# Patient Record
Sex: Male | Born: 1985 | Race: White | Hispanic: No | Marital: Married | Smoking: Former smoker
Health system: Southern US, Community
[De-identification: ages and names within clinical notes are randomized; demographics above are authoritative.]

## PROBLEM LIST (undated history)

## (undated) DIAGNOSIS — I471 Supraventricular tachycardia, unspecified: Secondary | ICD-10-CM

## (undated) DIAGNOSIS — J40 Bronchitis, not specified as acute or chronic: Secondary | ICD-10-CM

## (undated) DIAGNOSIS — F419 Anxiety disorder, unspecified: Secondary | ICD-10-CM

## (undated) DIAGNOSIS — J189 Pneumonia, unspecified organism: Secondary | ICD-10-CM

## (undated) HISTORY — DX: Supraventricular tachycardia: I47.1

---

## 2019-10-03 ENCOUNTER — Emergency Department
Admission: RE | Admit: 2019-10-03 | Discharge: 2019-10-03 | Disposition: A | Discharge status: Home / Self Care | Payer: Self-pay | Source: Ambulatory Visit | Attending: Family Medicine | Admitting: Family Medicine

## 2019-10-03 ENCOUNTER — Emergency Department (INDEPENDENT_AMBULATORY_CARE_PROVIDER_SITE_OTHER): Payer: Self-pay

## 2019-10-03 ENCOUNTER — Other Ambulatory Visit: Payer: Self-pay

## 2019-10-03 VITALS — BP 128/85 | HR 87 | Temp 97.4°F | Resp 18 | Ht 73.0 in | Wt 213.0 lb

## 2019-10-03 DIAGNOSIS — R202 Paresthesia of skin: Secondary | ICD-10-CM

## 2019-10-03 HISTORY — DX: Anxiety disorder, unspecified: F41.9

## 2019-10-03 HISTORY — DX: Supraventricular tachycardia, unspecified: I47.10

## 2019-10-03 NOTE — ED Triage Notes (Signed)
Patient here for concerns about chest and arm pain over past 3 days; in very stressful work situation with little rest; history of SVT at age 34; no recent heart concerns. Did take ASA yesterday for left arm pain which radiates between elbow and shoulder. Has not had covid vaccination.

## 2019-10-03 NOTE — ED Notes (Signed)
Call to [facility]: requesting information about today's visit and they will then call patient with an appointment.

## 2019-10-03 NOTE — ED Provider Notes (Signed)
[name]    CSN: [id number] Arrival date & time: 10/03/19  1232      History   Chief Complaint Chief Complaint  Patient presents with  . Chest Pain    HPI Justin Travis is a 34 y.o. male.   While sitting on a couch three days ago patient developed vague pain in his left elbow that resolved spontaneously.  He recalls no injury or change in activities.  He has had recurring brief episodes of tingling sensations in his left elbow and upper arm.  During an 8 hour shift at work yesterday, sitting at a desk, he had prolonged paresthesias in his left arm and also noted decreased grip strength in his left hand.  He also has had vague intermittent discomfort in his left upper chest.  His symptoms occur only at rest.   The history is provided by the patient.    Past Medical History:  Diagnosis Date  . Anxiety   . SVT (supraventricular tachycardia) (HCC)    none since age 63    There are no problems to display for this patient.   History reviewed. No pertinent surgical history.     Home Medications    Prior to Admission medications   Not on File    Family History No family history on file.  Social History Social History   Tobacco Use  . Smoking status: Not on file  Substance Use Topics  . Alcohol use: Not on file  . Drug use: Not on file     Allergies   Patient has no known allergies.   Review of Systems Review of Systems  Constitutional: Negative for activity change, appetite change, chills, diaphoresis, fatigue and fever.  HENT: Negative.   Eyes: Negative.   Respiratory: Negative.   Cardiovascular: Positive for chest pain. Negative for palpitations and leg swelling.  Gastrointestinal: Negative.   Genitourinary: Negative.   Musculoskeletal: Negative.  Negative for neck pain and neck stiffness.  Skin: Negative.   Neurological: Positive for numbness. Negative for dizziness, tremors, seizures, syncope, speech difficulty, weakness,  light-headedness and headaches.  Hematological: Negative.      Physical Exam Triage Vital Signs ED Triage Vitals  Enc Vitals Group     BP 10/03/19 1304 128/85     Pulse Rate 10/03/19 1304 87     Resp 10/03/19 1304 18     Temp 10/03/19 1304 (!) 97.4 F (36.3 C)     Temp Source 10/03/19 1304 Oral     SpO2 10/03/19 1304 98 %     Weight 10/03/19 1305 (!) 213 lb (96.6 kg)     Height 10/03/19 1305 6\' 1"  (1.854 m)     Head Circumference --      Peak Flow --      Pain Score 10/03/19 1304 3     Pain Loc --      Pain Edu? --      Excl. in GC? --    No data found.  Updated Vital Signs BP 128/85 (BP Location: Left Arm)   Pulse 87   Temp (!) 97.4 F (36.3 C) (Oral)   Resp 18   Ht 6\' 1"  (1.854 m)   Wt (!) 96.6 kg   SpO2 98%   BMI 28.10 kg/m   Visual Acuity Right Eye Distance:   Left Eye Distance:   Bilateral Distance:    Right Eye Near:   Left Eye Near:    Bilateral Near:     Physical Exam Vitals  and nursing note reviewed.  Constitutional:      General: He is not in acute distress.    Appearance: He is not ill-appearing.  HENT:     Head: Normocephalic.     Right Ear: External ear normal.     Left Ear: External ear normal.     Nose: Nose normal.     Mouth/Throat:     Mouth: Mucous membranes are moist.     Pharynx: Oropharynx is clear.  Eyes:     Extraocular Movements: Extraocular movements intact.     Conjunctiva/sclera: Conjunctivae normal.     Pupils: Pupils are equal, round, and reactive to light.  Neck:     Comments: Patient's symptoms are not recreated by neck movement. Cardiovascular:     Rate and Rhythm: Normal rate and regular rhythm.     Pulses: Normal pulses.     Heart sounds: Normal heart sounds.  Pulmonary:     Breath sounds: Normal breath sounds.  Chest:     Chest wall: No tenderness.  Abdominal:     General: Abdomen is flat. Bowel sounds are normal.     Palpations: Abdomen is soft.     Tenderness: There is no abdominal tenderness.    Musculoskeletal:        General: No swelling or tenderness.     Cervical back: Normal range of motion and neck supple. No tenderness.     Right lower leg: No edema.     Left lower leg: No edema.  Lymphadenopathy:     Cervical: No cervical adenopathy.  Skin:    General: Skin is warm and dry.     Findings: No rash.  Neurological:     General: No focal deficit present.     Mental Status: He is alert and oriented to person, place, and time.     Cranial Nerves: Cranial nerves are intact.     Sensory: Sensation is intact.     Motor: Motor function is intact.     Coordination: Coordination is intact.     Gait: Gait is intact.     Deep Tendon Reflexes:     Reflex Scores:      Brachioradialis reflexes are 2+ on the right side and 2+ on the left side.      Patellar reflexes are 2+ on the right side and 2+ on the left side.      Achilles reflexes are 2+ on the right side and 2+ on the left side.     UC Treatments / Results  Labs (all labs ordered are listed, but only abnormal results are displayed) Labs Reviewed - No data to display  EKG  Rate:  83 BPM PR:  154 msec QT:  366 msec QTcH:  430 msec QRSD:  86 msec QRS axis:  67 degrees Interpretation:  within normal limits; normal sinus rhythm    Radiology DG Cervical Spine Complete  Result Date: 10/03/2019 CLINICAL DATA:  Left upper extremity paresthesias.  No known injury. EXAM: CERVICAL SPINE - COMPLETE 4+ VIEW COMPARISON:  None. FINDINGS: There is no evidence of cervical spine fracture or prevertebral soft tissue swelling. Straightening of the cervical lordosis without static listhesis. Intervertebral disc spaces are well maintained. Oblique views reveal widely patent bony foramina bilaterally. No other significant bone abnormalities are identified. IMPRESSION: Negative cervical spine radiographs. Electronically Signed   By: Duanne Guess D.O.   On: 10/03/2019 14:28    Procedures Procedures (including critical care  time)  Medications Ordered in UC  Medications - No data to display  Initial Impression / Assessment and Plan / UC Course  I have reviewed the triage vital signs and the nursing notes.  Pertinent labs & imaging results that were available during my care of the patient were reviewed by me and considered in my medical decision making (see chart for details).    Normal EKG and negative C-spine X-ray reassuring. Unremarkable physical exam.  No immediate explanation for patient's transient paresthesia/weakness left arm.  Concern for neurological conditions such as early multiple sclerosis. Will refer to neurologist.   Final Clinical Impressions(s) / UC Diagnoses   Final diagnoses:  Paresthesia of left arm     Discharge Instructions     If symptoms become significantly worse during the night or over the weekend, proceed to the local emergency room.    ED Prescriptions    None        Lattie Haw, MD 10/04/19 1625

## 2019-10-03 NOTE — Discharge Instructions (Signed)
If symptoms become significantly worse during the night or over the weekend, proceed to the local emergency room.  

## 2019-10-04 ENCOUNTER — Telehealth: Payer: Self-pay | Admitting: Emergency Medicine

## 2019-10-04 NOTE — Telephone Encounter (Signed)
Request for ed notes to be sent to [facility] by Dr Cathren Harsh - done at 1642 w/ dx code

## 2019-10-07 ENCOUNTER — Telehealth: Payer: Self-pay | Admitting: Emergency Medicine

## 2019-10-07 NOTE — Telephone Encounter (Signed)
Return call to wanda regarding demographic info for patient scheduling - demographic sheet to be faxed to [facility].

## 2020-02-23 ENCOUNTER — Emergency Department
Admission: EM | Admit: 2020-02-23 | Discharge: 2020-02-23 | Disposition: A | Discharge status: Home / Self Care | Payer: Self-pay | Source: Home / Self Care | Attending: Family Medicine | Admitting: Family Medicine

## 2020-02-23 ENCOUNTER — Encounter: Payer: Self-pay | Admitting: Emergency Medicine

## 2020-02-23 ENCOUNTER — Other Ambulatory Visit: Payer: Self-pay

## 2020-02-23 ENCOUNTER — Emergency Department (INDEPENDENT_AMBULATORY_CARE_PROVIDER_SITE_OTHER): Payer: HRSA Program

## 2020-02-23 DIAGNOSIS — U071 COVID-19: Secondary | ICD-10-CM

## 2020-02-23 DIAGNOSIS — R059 Cough, unspecified: Secondary | ICD-10-CM

## 2020-02-23 LAB — POC SARS CORONAVIRUS 2 AG -  ED: SARS Coronavirus 2 Ag: POSITIVE — AB

## 2020-02-23 NOTE — ED Provider Notes (Signed)
[name]    CSN: [id number] Arrival date & time: 02/23/20  0844      History   Chief Complaint Chief Complaint  Patient presents with  . Generalized Body Aches  . Cough    HPI Hamp Moreland is a 34 y.o. male.   HPI   Patient states he has several days of body aches and cough.  No sore throat or runny nose.  No loss of taste or smell.  He states that when he coughs or takes a deep breath he has pain in his lower chest.  He has had pneumonia in the past.  He states this is similar to the way he felt when he had pneumonia.  He has not taken his temperature but denies fever. Patient has not had Covid vaccine or pneumonia vaccine. Patient has history of supraventricular tachycardia.  He states his heart rate is intermittently fast.  He insists it is because he has a pinched nerve in his neck.  Patient does have tachycardia today.  Because tachycardia can be associated with Covid I am doing Covid testing on him.  He does not have allergies.  Intermittently has asthma.  He does not smoke cigarettes.  No one else in his household is sick  Past Medical History:  Diagnosis Date  . Anxiety   . SVT (supraventricular tachycardia) (HCC)    none since age 10    There are no problems to display for this patient.   History reviewed. No pertinent surgical history.     Home Medications    Prior to Admission medications   Medication Sig Start Date End Date Taking? Authorizing Provider  Ascorbic Acid (VITAMIN C WITH ROSE HIPS) 500 MG tablet Take 500 mg by mouth daily.    [provider]  magnesium 30 MG tablet Take 30 mg by mouth 2 (two) times daily.    [provider]  Vitamin D, Ergocalciferol, (DRISDOL) 1.25 MG (50000 UNIT) CAPS capsule Take 50,000 Units by mouth every 7 (seven) days.    [provider]  vitamin E 1000 UNIT capsule Take 1,000 Units by mouth daily.    [provider]  zinc gluconate 50 MG tablet Take 50 mg by mouth  daily.    [provider]    Family History Family History  Problem Relation Age of Onset  . Healthy Mother   . Healthy Father   . Healthy Brother     Social History Social History   Tobacco Use  . Smoking status: Never Smoker  . Smokeless tobacco: Never Used  Vaping Use  . Vaping Use: Never used  Substance Use Topics  . Alcohol use: Not Currently  . Drug use: Never     Allergies   Morphine   Review of Systems Review of Systems See HPI  Physical Exam Triage Vital Signs ED Triage Vitals  Enc Vitals Group     BP 02/23/20 0913 124/85     Pulse Rate 02/23/20 0913 (!) 122     Resp 02/23/20 0913 18     Temp 02/23/20 0913 99.7 F (37.6 C)     Temp Source 02/23/20 0913 Oral     SpO2 02/23/20 0913 97 %     Weight 02/23/20 0915 212 lb (96.2 kg)     Height 02/23/20 0915 6\' 1"  (1.854 m)     Head Circumference --      Peak Flow --      Pain Score 02/23/20 0914 4  Pain Loc --      Pain Edu? --      Excl. in GC? --    No data found.  Updated Vital Signs BP 124/85 (BP Location: Left Arm)   Pulse (!) 122   Temp 99.7 F (37.6 C) (Oral)   Resp 18   Ht 6\' 1"  (1.854 m)   Wt 96.2 kg   SpO2 97%   BMI 27.97 kg/m       Physical Exam Constitutional:      General: He is not in acute distress.    Appearance: He is well-developed and well-nourished.     Comments: Appears well.  Mask is in place  HENT:     Head: Normocephalic and atraumatic.     Right Ear: External ear normal.     Left Ear: External ear normal.     Nose: Nose normal.     Mouth/Throat:     Mouth: Oropharynx is clear and moist. Mucous membranes are moist.     Pharynx: No posterior oropharyngeal erythema.  Eyes:     Conjunctiva/sclera: Conjunctivae normal.     Pupils: Pupils are equal, round, and reactive to light.  Cardiovascular:     Rate and Rhythm: Tachycardia present.  Pulmonary:     Effort: Pulmonary effort is normal. No respiratory distress.     Comments: Decreased lung  sounds in left base Abdominal:     General: There is no distension.     Palpations: Abdomen is soft.  Musculoskeletal:        General: No edema. Normal range of motion.     Cervical back: Normal range of motion.  Skin:    General: Skin is warm and dry.  Neurological:     Mental Status: He is alert.  Psychiatric:        Behavior: Behavior normal.      UC Treatments / Results  Labs (all labs ordered are listed, but only abnormal results are displayed) Labs Reviewed  POC SARS CORONAVIRUS 2 AG -  ED - Abnormal; Notable for the following components:      Result Value   SARS Coronavirus 2 Ag Positive (*)    All other components within normal limits    EKG   Radiology DG Chest 2 View  Result Date: 02/23/2020 CLINICAL DATA:  Cough.  Body aches. EXAM: CHEST - 2 VIEW COMPARISON:  None. FINDINGS: Opacity in left base appears to be platelike on the lateral view. The heart, hila, mediastinum are normal. No pneumothorax. No nodules or masses. No other infiltrates. IMPRESSION: Opacity in left base is nonspecific on the frontal view but appears platelike on the lateral view suggesting atelectasis. Based on the lateral view, developing infiltrate or pneumonia is considered less likely. Recommend attention on short-term follow-up imaging. Electronically Signed   By: 02/25/2020 III M.D   On: 02/23/2020 10:11    Procedures Procedures (including critical care time)  Medications Ordered in UC Medications - No data to display  Initial Impression / Assessment and Plan / UC Course  I have reviewed the triage vital signs and the nursing notes.  Pertinent labs & imaging results that were available during my care of the patient were reviewed by me and considered in my medical decision making (see chart for details).    Explained to patient that he is Covid positive. Also has abnormal chest x-ray that needs to be followed up. He he needs to see his PCP in 3 to 4 weeks for repeat  chest  x-ray. Importance of quarantine is emphasized to patient Patient is told to expect a call from the hospital regarding his recovery and whether he is appropriate for antibiodies Final Clinical Impressions(s) / UC Diagnoses   Final diagnoses:  COVID-19     Discharge Instructions     Stay home Quarantine for 10 days You may take OTC cough and cold medicine Tylenol or ibuprofen for pain and fever     ED Prescriptions    None     PDMP not reviewed this encounter.   Eustace Moore, MD 02/23/20 1045

## 2020-02-23 NOTE — ED Triage Notes (Addendum)
Woke up Thursday w/ body aches & cough  Denies sore throat or loss of taste or smell Denies fever [place] of appetite since Friday  Back pain per pt similar to when pt had pneumonia in past No COVID or Flu vaccine

## 2020-02-23 NOTE — Discharge Instructions (Signed)
Stay home Quarantine for 10 days You may take OTC cough and cold medicine Tylenol or ibuprofen for pain and fever

## 2020-03-02 ENCOUNTER — Emergency Department: Admission: EM | Admit: 2020-03-02 | Discharge: 2020-03-02 | Discharge status: Against Medical Advice | Payer: Self-pay

## 2021-04-08 ENCOUNTER — Emergency Department
Admission: RE | Admit: 2021-04-08 | Discharge: 2021-04-08 | Disposition: A | Discharge status: Home / Self Care | Payer: Self-pay | Source: Ambulatory Visit

## 2021-04-08 ENCOUNTER — Other Ambulatory Visit: Payer: Self-pay

## 2021-04-08 VITALS — BP 118/79 | HR 88 | Temp 98.1°F | Resp 20 | Ht 73.0 in | Wt 215.0 lb

## 2021-04-08 DIAGNOSIS — R059 Cough, unspecified: Secondary | ICD-10-CM

## 2021-04-08 DIAGNOSIS — J029 Acute pharyngitis, unspecified: Secondary | ICD-10-CM

## 2021-04-08 HISTORY — DX: Pneumonia, unspecified organism: J18.9

## 2021-04-08 HISTORY — DX: Bronchitis, not specified as acute or chronic: J40

## 2021-04-08 LAB — POCT INFLUENZA A/B
Influenza A, POC: NEGATIVE
Influenza B, POC: NEGATIVE

## 2021-04-08 LAB — POC SARS CORONAVIRUS 2 AG -  ED: SARS Coronavirus 2 Ag: NEGATIVE

## 2021-04-08 MED ORDER — AZITHROMYCIN 250 MG PO TABS: 250.0000 mg | ORAL_TABLET | Freq: Every day | ORAL | 0 refills | Status: DC

## 2021-04-08 MED ORDER — BENZONATATE 200 MG PO CAPS: 200.0000 mg | ORAL_CAPSULE | Freq: Three times a day (TID) | ORAL | 0 refills | Status: AC | PRN

## 2021-04-08 MED ORDER — PREDNISONE 20 MG PO TABS: ORAL_TABLET | ORAL | 0 refills | Status: DC

## 2021-04-08 NOTE — ED Triage Notes (Signed)
Pt presents to Urgent Care with c/o sore throat and cough x 3 days. Also reports body aches at symptom onset. States he also has fatigue and reduced appetite.

## 2021-04-08 NOTE — Discharge Instructions (Addendum)
Advised patient to take medication as directed with food to completion.  Advised patient to take prednisone with Zithromax daily for the next 5 days.  Advised patient may take Tessalon Perles daily or as needed for cough.  Encouraged patient to increase daily water intake while taking these medications.

## 2021-04-08 NOTE — ED Provider Notes (Signed)
[facility]    CSN: [id number] Arrival date & time: 04/08/21  1804      History   Chief Complaint Chief Complaint  Patient presents with   Sore Throat   Cough    HPI Justin Travis is a 36 y.o. male.   HPI 36 year old male presents with sore throat and cough for 3 days.  Additionally, patient reports myalgias at onset of symptoms 3 days ago including fatigue and reduced appetite.  Past Medical History:  Diagnosis Date   Anxiety    Bronchitis    Pneumonia    SVT (supraventricular tachycardia) (HCC)    none since age 47    There are no problems to display for this patient.   History reviewed. No pertinent surgical history.     Home Medications    Prior to Admission medications   Medication Sig Start Date End Date Taking? Authorizing Provider  azithromycin (ZITHROMAX) 250 MG tablet Take 1 tablet (250 mg total) by mouth daily. Take first 2 tablets together, then 1 every day until finished. 04/08/21  Yes Trevor Iha, FNP  benzonatate (TESSALON) 200 MG capsule Take 1 capsule (200 mg total) by mouth 3 (three) times daily as needed for up to 7 days for cough. 04/08/21 04/15/21 Yes Trevor Iha, FNP  predniSONE (DELTASONE) 20 MG tablet Take 3 tabs PO daily x 5 days. 04/08/21  Yes Trevor Iha, FNP  Pseudoeph-Doxylamine-DM-APAP (DAYQUIL/NYQUIL COLD/FLU RELIEF PO) Take by mouth.   Yes [provider]  Ascorbic Acid (VITAMIN C WITH ROSE HIPS) 500 MG tablet Take 500 mg by mouth daily.    [provider]  magnesium 30 MG tablet Take 30 mg by mouth 2 (two) times daily.    [provider]  Vitamin D, Ergocalciferol, (DRISDOL) 1.25 MG (50000 UNIT) CAPS capsule Take 50,000 Units by mouth every 7 (seven) days.    [provider]  vitamin E 1000 UNIT capsule Take 1,000 Units by mouth daily.    [provider]  zinc gluconate 50 MG tablet Take 50 mg by mouth daily.    [provider]    Family History Family History   Problem Relation Age of Onset   Healthy Mother    Healthy Father    Healthy Brother     Social History Social History   Tobacco Use   Smoking status: Some Days    Types: Cigars   Smokeless tobacco: Never  Vaping Use   Vaping Use: Never used  Substance Use Topics   Alcohol use: Yes    Comment: occasionally   Drug use: Never     Allergies   Morphine   Review of Systems Review of Systems  Constitutional:  Positive for fatigue.  HENT:  Positive for sore throat.   Respiratory:  Positive for cough.   All other systems reviewed and are negative.   Physical Exam Triage Vital Signs ED Triage Vitals  Enc Vitals Group     BP 04/08/21 1824 118/79     Pulse Rate 04/08/21 1824 88     Resp 04/08/21 1824 20     Temp 04/08/21 1824 98.1 F (36.7 C)     Temp Source 04/08/21 1824 Oral     SpO2 04/08/21 1824 96 %     Weight 04/08/21 1819 215 lb (97.5 kg)     Height 04/08/21 1819 6\' 1"  (1.854 m)     Head Circumference --      Peak Flow --  Pain Score 04/08/21 1817 5     Pain Loc --      Pain Edu? --      Excl. in GC? --    No data found.  Updated Vital Signs BP 118/79 (BP Location: Right Arm)    Pulse 88    Temp 98.1 F (36.7 C) (Oral)    Resp 20    Ht 6\' 1"  (1.854 m)    Wt 215 lb (97.5 kg)    SpO2 96%    BMI 28.37 kg/m       Physical Exam Vitals and nursing note reviewed.  Constitutional:      General: He is not in acute distress.    Appearance: Normal appearance. He is well-developed and normal weight. He is not ill-appearing.  HENT:     Head: Normocephalic and atraumatic.     Right Ear: Tympanic membrane, ear canal and external ear normal.     Left Ear: Tympanic membrane, ear canal and external ear normal.     Mouth/Throat:     Mouth: Mucous membranes are moist.     Pharynx: Oropharynx is clear. Uvula midline. Posterior oropharyngeal erythema present. No pharyngeal swelling or uvula swelling.  Eyes:     Conjunctiva/sclera: Conjunctivae normal.      Pupils: Pupils are equal, round, and reactive to light.  Cardiovascular:     Rate and Rhythm: Normal rate and regular rhythm.     Pulses: Normal pulses.     Heart sounds: Normal heart sounds. No murmur heard. Pulmonary:     Effort: Pulmonary effort is normal.     Breath sounds: Normal breath sounds.  Musculoskeletal:     Cervical back: Normal range of motion and neck supple. No tenderness.  Lymphadenopathy:     Cervical: No cervical adenopathy.  Skin:    General: Skin is warm and dry.  Neurological:     General: No focal deficit present.     Mental Status: He is alert and oriented to person, place, and time.     UC Treatments / Results  Labs (all labs ordered are listed, but only abnormal results are displayed) Labs Reviewed  POC SARS CORONAVIRUS 2 AG -  ED  POCT INFLUENZA A/B    EKG   Radiology No results found.  Procedures Procedures (including critical care time)  Medications Ordered in UC Medications - No data to display  Initial Impression / Assessment and Plan / UC Course  I have reviewed the triage vital signs and the nursing notes.  Pertinent labs & imaging results that were available during my care of the patient were reviewed by me and considered in my medical decision making (see chart for details).     MDM: 1.  Pharyngitis-Rx'd Zithromax; 2.  Cough-Rx'd Prednisone and Tessalon Perles. Advised patient to take medication as directed with food to completion.  Advised patient to take prednisone with Zithromax daily for the next 5 days.  Advised patient may take Tessalon Perles daily or as needed for cough.  Encouraged patient to increase daily water intake while taking these medications.  Work note provided prior to discharge this evening.  Patient discharged home, hemodynamically stable. Final Clinical Impressions(s) / UC Diagnoses   Final diagnoses:  Pharyngitis, unspecified etiology  Cough, unspecified type     Discharge Instructions      Advised  patient to take medication as directed with food to completion.  Advised patient to take prednisone with Zithromax daily for the next 5 days.  Advised  patient may take Tessalon Perles daily or as needed for cough.  Encouraged patient to increase daily water intake while taking these medications.     ED Prescriptions     Medication Sig Dispense Auth. Provider   azithromycin (ZITHROMAX) 250 MG tablet Take 1 tablet (250 mg total) by mouth daily. Take first 2 tablets together, then 1 every day until finished. 6 tablet Trevor Ihaagan, Sabine Tenenbaum, FNP   predniSONE (DELTASONE) 20 MG tablet Take 3 tabs PO daily x 5 days. 15 tablet Trevor Ihaagan, Whitnie Deleon, FNP   benzonatate (TESSALON) 200 MG capsule Take 1 capsule (200 mg total) by mouth 3 (three) times daily as needed for up to 7 days for cough. 40 capsule Trevor Ihaagan, Rosalin Buster, FNP      PDMP not reviewed this encounter.   Trevor IhaRagan, Keath Matera, FNP 04/08/21 [phone]

## 2021-07-22 ENCOUNTER — Emergency Department
Admission: RE | Admit: 2021-07-22 | Discharge: 2021-07-22 | Disposition: A | Discharge status: Home / Self Care | Payer: Self-pay | Source: Ambulatory Visit | Attending: Family Medicine | Admitting: Family Medicine

## 2021-07-22 VITALS — BP 138/82 | HR 105 | Temp 98.6°F | Resp 16

## 2021-07-22 DIAGNOSIS — R112 Nausea with vomiting, unspecified: Secondary | ICD-10-CM

## 2021-07-22 DIAGNOSIS — K529 Noninfective gastroenteritis and colitis, unspecified: Secondary | ICD-10-CM

## 2021-07-22 NOTE — ED Triage Notes (Signed)
Pt presents with c/o abd pain that began yesterday. Pt states he then took liquid mag. Pt states he began having diarrhea, vomiting, and body aches. Pt states pain is intermittent today and NVD has subsided. Pt states he does not have an appetite and feels fatigued.

## 2021-07-22 NOTE — Discharge Instructions (Addendum)
It is important that you drink lots of fluids.  You need to rehydrate.  Consider Gatorade and sports drinks to start You can eat a bland diet.  Avoid fried food and fatty food and spicy food until you feel back to normal Call for problems

## 2021-07-22 NOTE — ED Provider Notes (Signed)
[facility]    CSN: [id number] Arrival date & time: 07/22/21  1551      History   Chief Complaint Chief Complaint  Patient presents with   Diarrhea    Body aches, nausea and vomiting - Entered by patient   Abdominal Pain    HPI Justin Travis is a 36 y.o. male.   HPI  Patient states that he had a normal workday Tuesday.  Tuesday after work he developed abdominal pain.  He felt bloated and thought maybe he was constipated.  He went ahead and took some milk of magnesia.  He was eventually able to fall asleep.  In the melanite he woke up with nausea vomiting and diarrhea.  He continues to have some soreness in his abdomen.  He feels somewhat better today.  Has not vomited since yesterday.  Still has diarrhea at times.  He is here because he needs a note for work and advice on how to get back his energy  Past Medical History:  Diagnosis Date   Anxiety    Bronchitis    Pneumonia    SVT (supraventricular tachycardia) (HCC)    none since age 36    There are no problems to display for this patient.   History reviewed. No pertinent surgical history.     Home Medications    Prior to Admission medications   Medication Sig Start Date End Date Taking? Authorizing Provider  Ascorbic Acid (VITAMIN C WITH ROSE HIPS) 500 MG tablet Take 500 mg by mouth daily.    [provider]  magnesium 30 MG tablet Take 30 mg by mouth 2 (two) times daily.    [provider]  Vitamin D, Ergocalciferol, (DRISDOL) 1.25 MG (50000 UNIT) CAPS capsule Take 50,000 Units by mouth every 7 (seven) days.    [provider]  vitamin E 1000 UNIT capsule Take 1,000 Units by mouth daily.    [provider]  zinc gluconate 50 MG tablet Take 50 mg by mouth daily.    [provider]    Family History Family History  Problem Relation Age of Onset   Healthy Mother    Healthy Father    Healthy Brother     Social History Social History   Tobacco Use    Smoking status: Some Days    Types: Cigars   Smokeless tobacco: Never  Vaping Use   Vaping Use: Never used  Substance Use Topics   Alcohol use: Yes    Comment: occasionally   Drug use: Never     Allergies   Morphine   Review of Systems Review of Systems See HPI  Physical Exam Triage Vital Signs ED Triage Vitals  Enc Vitals Group     BP 07/22/21 1604 138/82     Pulse Rate 07/22/21 1604 (!) 105     Resp 07/22/21 1604 16     Temp 07/22/21 1604 98.6 F (37 C)     Temp Source 07/22/21 1604 Oral     SpO2 07/22/21 1604 97 %     Weight --      Height --      Head Circumference --      Peak Flow --      Pain Score 07/22/21 1608 0     Pain Loc --      Pain Edu? --      Excl. in GC? --    No data found.  Updated Vital Signs BP 138/82 (BP Location: Right Arm)  Pulse (!) 105   Temp 98.6 F (37 C) (Oral)   Resp 16   SpO2 97%      Physical Exam Constitutional:      General: He is not in acute distress.    Appearance: He is well-developed.  HENT:     Head: Normocephalic and atraumatic.     Mouth/Throat:     Comments: Tongue slightly dry Eyes:     Conjunctiva/sclera: Conjunctivae normal.     Pupils: Pupils are equal, round, and reactive to light.  Cardiovascular:     Rate and Rhythm: Normal rate.  Pulmonary:     Effort: Pulmonary effort is normal. No respiratory distress.  Abdominal:     General: Bowel sounds are normal. There is no distension.     Palpations: Abdomen is soft.     Tenderness: There is no abdominal tenderness.     Comments: Very mild generalized tenderness.  No organomegaly or mass.  Musculoskeletal:        General: Normal range of motion.     Cervical back: Normal range of motion.  Skin:    General: Skin is warm and dry.  Neurological:     General: No focal deficit present.     Mental Status: He is alert.     UC Treatments / Results  Labs (all labs ordered are listed, but only abnormal results are displayed) Labs Reviewed - No  data to display  EKG   Radiology No results found.  Procedures Procedures (including critical care time)  Medications Ordered in UC Medications - No data to display  Initial Impression / Assessment and Plan / UC Course  I have reviewed the triage vital signs and the nursing notes.  Pertinent labs & imaging results that were available during my care of the patient were reviewed by me and considered in my medical decision making (see chart for details).     Discussed gastroenteritis.  With tachycardia and a slightly dry tongue he may be a little dehydrated. Final Clinical Impressions(s) / UC Diagnoses   Final diagnoses:  Gastroenteritis  Nausea vomiting and diarrhea     Discharge Instructions      It is important that you drink lots of fluids.  You need to rehydrate.  Consider Gatorade and sports drinks to start You can eat a bland diet.  Avoid fried food and fatty food and spicy food until you feel back to normal Call for problems   ED Prescriptions   None    PDMP not reviewed this encounter.   Raylene Everts, MD 07/22/21 [phone]

## 2021-08-20 IMAGING — DX DG CERVICAL SPINE COMPLETE 4+V
6 series · 6 of 6 positions shown · non-contrast
Comparison: None.

CLINICAL DATA: Left upper extremity paresthesias.  No known injury.

EXAM:
CERVICAL SPINE - COMPLETE 4+ VIEW

[c-spine lat]
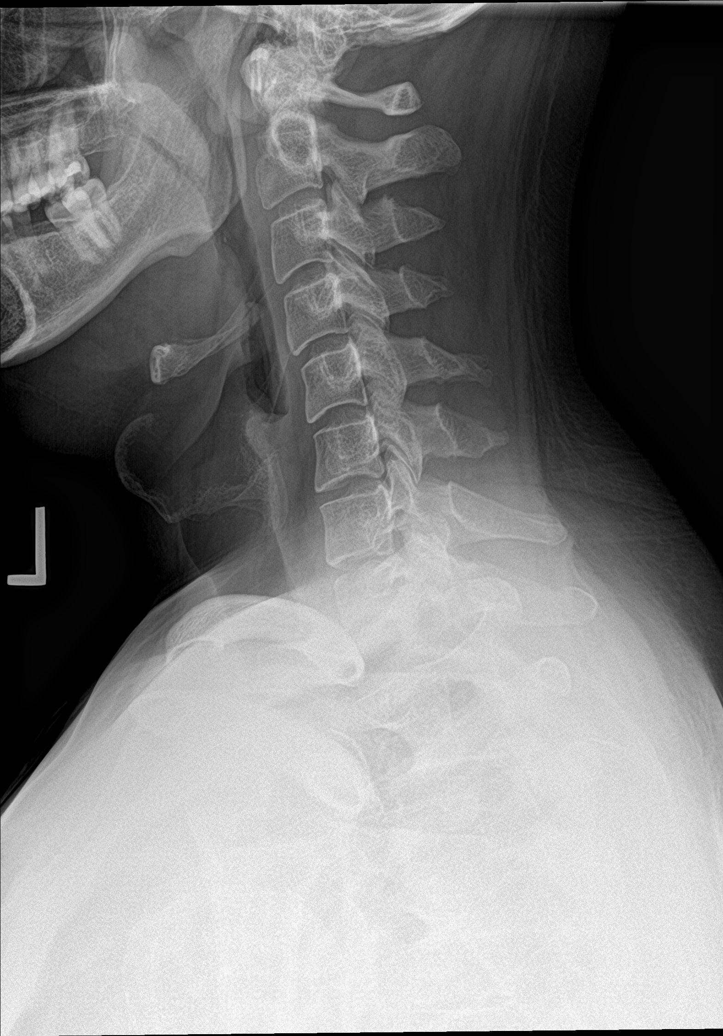

[c-spine obl (1 of 2)]
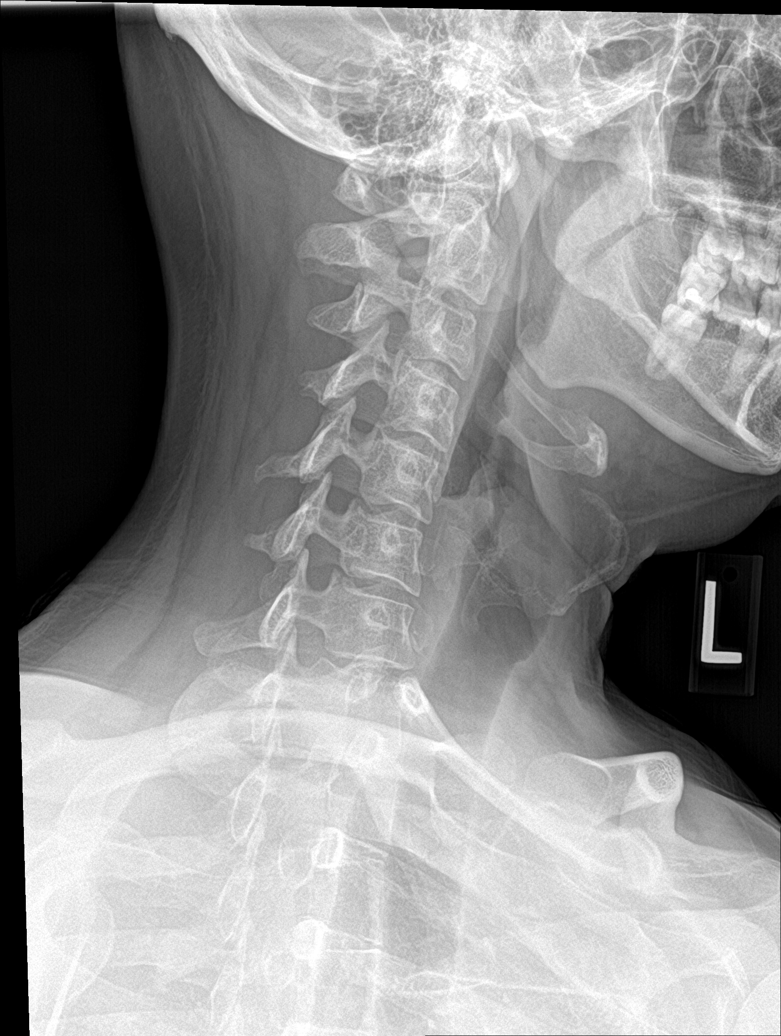

[c-spine obl (2 of 2)]
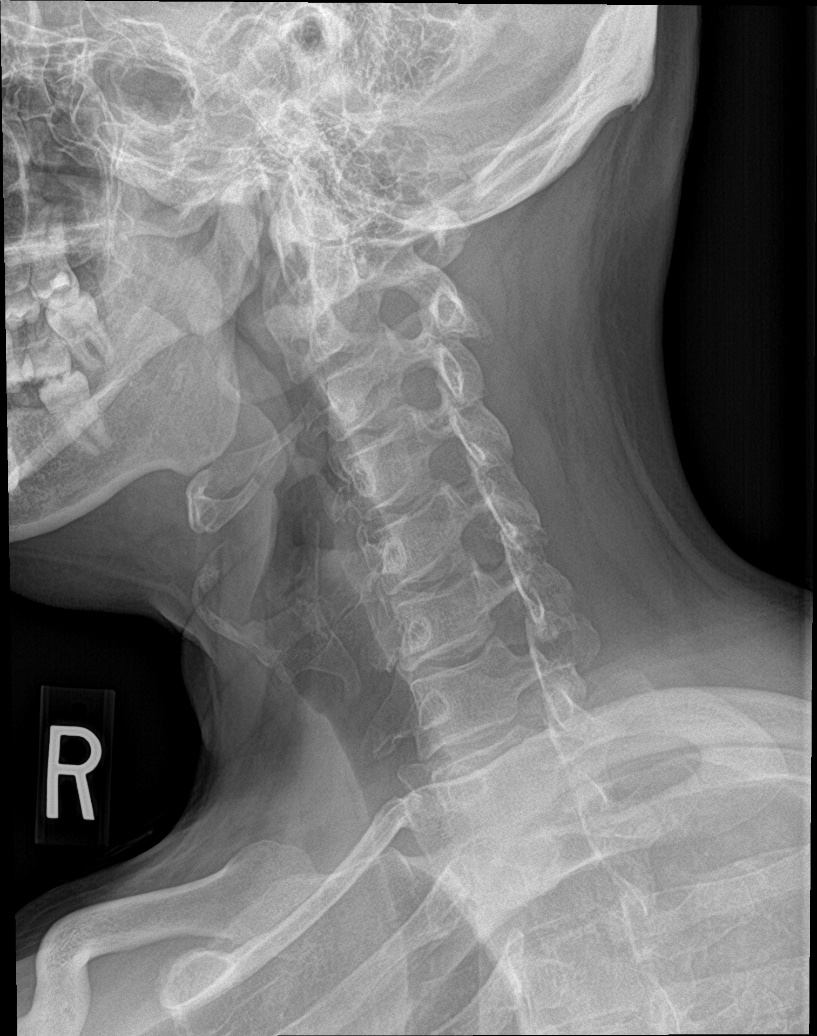

[c-spine ap]
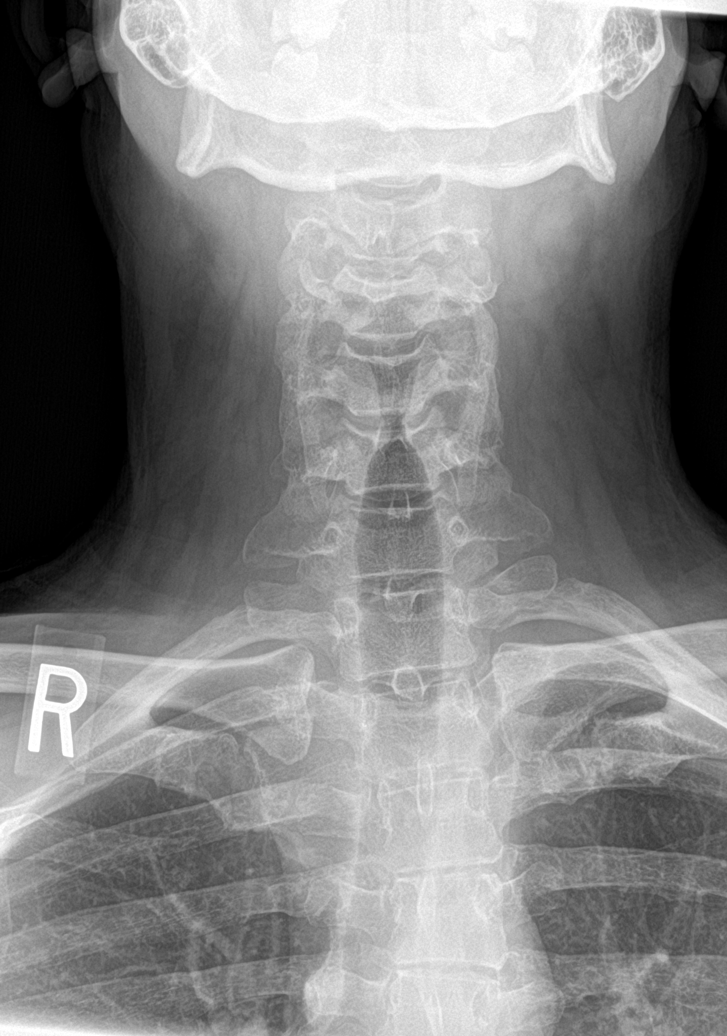

[c-spine open mouth]
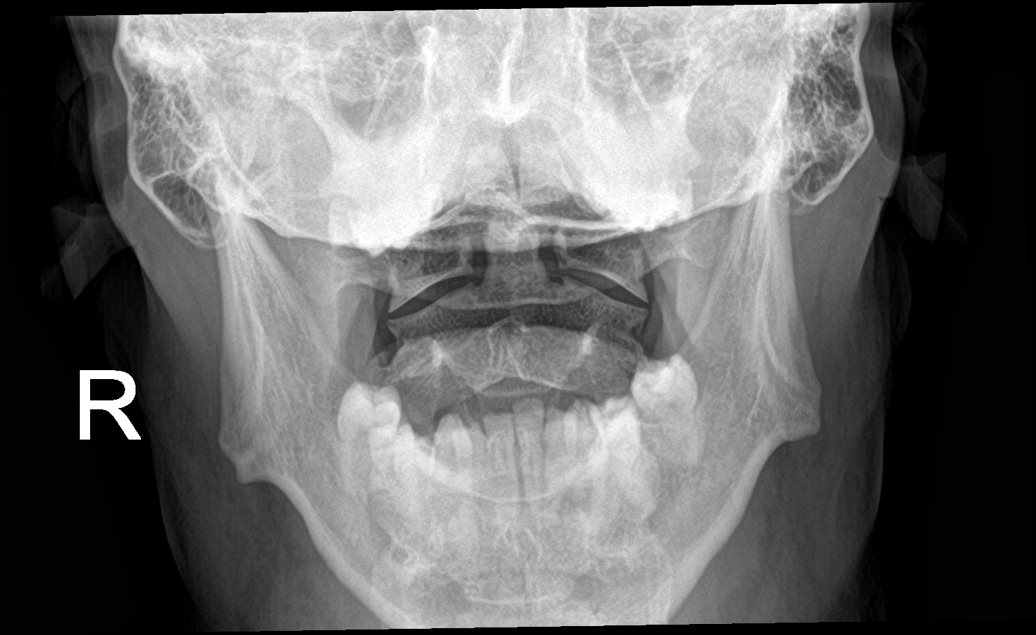

[c-spine swimmers]
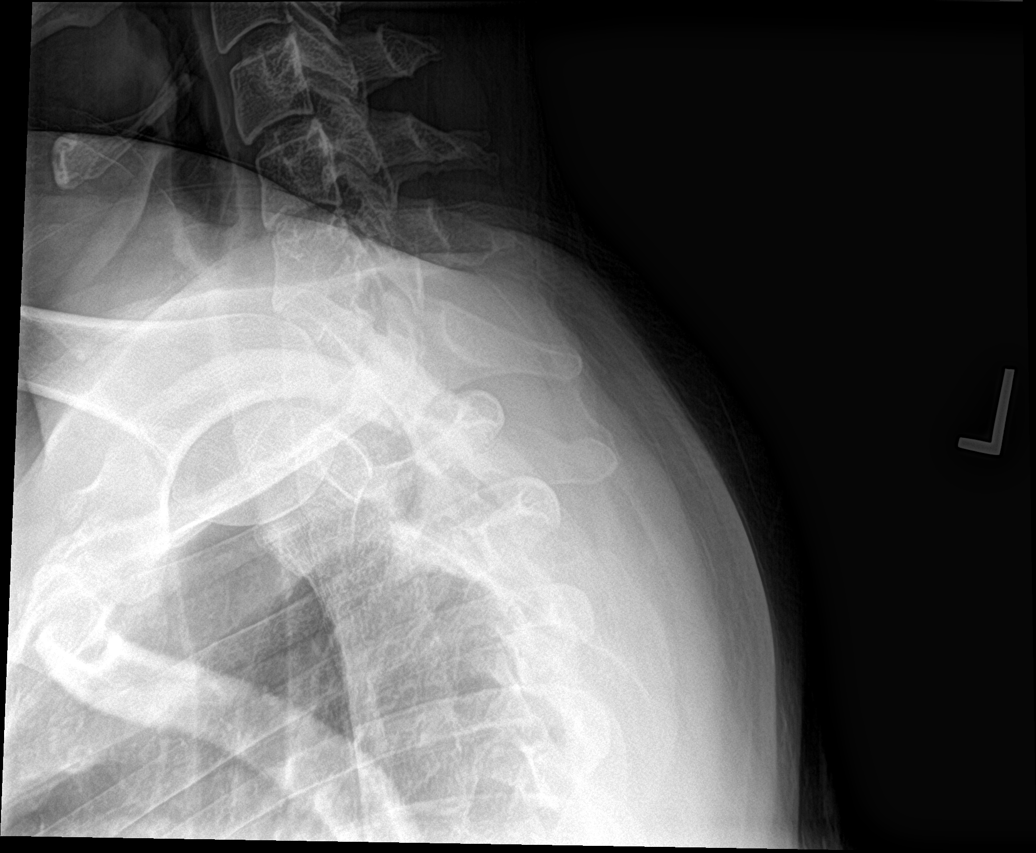

[6 of 6 positions shown; findings below may reference images not displayed]

FINDINGS: There is no evidence of cervical spine fracture or prevertebral soft
tissue swelling. Straightening of the cervical lordosis without
static listhesis. Intervertebral disc spaces are well maintained.
Oblique views reveal widely patent bony foramina bilaterally. No
other significant bone abnormalities are identified.
IMPRESSION: Negative cervical spine radiographs.

## 2021-12-18 ENCOUNTER — Encounter: Payer: Self-pay | Admitting: Emergency Medicine

## 2021-12-18 ENCOUNTER — Ambulatory Visit
Admission: EM | Admit: 2021-12-18 | Discharge: 2021-12-18 | Disposition: A | Discharge status: Home / Self Care | Payer: Self-pay | Attending: Family Medicine | Admitting: Family Medicine

## 2021-12-18 DIAGNOSIS — R1011 Right upper quadrant pain: Secondary | ICD-10-CM

## 2021-12-18 DIAGNOSIS — R109 Unspecified abdominal pain: Secondary | ICD-10-CM

## 2021-12-18 LAB — POCT URINALYSIS DIP (MANUAL ENTRY)
Bilirubin, UA: NEGATIVE
Blood, UA: NEGATIVE
Glucose, UA: NEGATIVE mg/dL
Ketones, POC UA: NEGATIVE mg/dL
Leukocytes, UA: NEGATIVE
Nitrite, UA: NEGATIVE
Protein Ur, POC: NEGATIVE mg/dL
Spec Grav, UA: 1.015 (ref 1.010–1.025)
Urobilinogen, UA: 0.2 E.U./dL
pH, UA: 7 (ref 5.0–8.0)

## 2021-12-18 MED ORDER — TRAMADOL-ACETAMINOPHEN 37.5-325 MG PO TABS: 1.0000 | ORAL_TABLET | Freq: Four times a day (QID) | ORAL | 0 refills | Status: AC | PRN

## 2021-12-18 MED ORDER — METHYLPREDNISOLONE 4 MG PO TBPK: ORAL_TABLET | ORAL | 0 refills | Status: AC

## 2021-12-18 NOTE — Discharge Instructions (Addendum)
Take the Medrol as directed.  Take all of day 1 today.  This is a steroid anti-inflammatory to reduce pain and swelling in the ribs Take Ultracet if needed for pain Check MyChart tomorrow for your lab test report.  You will be called if any tests are abnormal Follow-up if you fail to improve by next week

## 2021-12-18 NOTE — ED Triage Notes (Signed)
Patient c/o right sided side pain that starts mid back and radiates around to the front x 1 week.  No injury.  Hurts worse when sitting.  Some cloudy urine, no dysuria, no vomiting, no hematuria.  Patient denies any OTC meds.

## 2021-12-18 NOTE — ED Provider Notes (Signed)
[name]    CSN: [id number] Arrival date & time: 12/18/21  0813      History   Chief Complaint Chief Complaint  Patient presents with   Right sided side pain    HPI Justin Travis is a 36 y.o. male.   HPI  Patient states he has been having pain in his right flank for about a week.  He states that it is getting worse over time.  He states at first he thought it was just "gas".  It has not gone away.  It is worse with movement.  He cannot lay flat on his back.  It is not worse with deep breath.  No change in urination.  No nausea or vomiting.  No change in bowels.  No lifting or bending or injury to area.  His wife is concerned about his liver.  He does not drink alcohol or smoke cigarettes  Past Medical History:  Diagnosis Date   Anxiety    Bronchitis    Pneumonia    SVT (supraventricular tachycardia)    none since age 89    There are no problems to display for this patient.   History reviewed. No pertinent surgical history.     Home Medications    Prior to Admission medications   Medication Sig Start Date End Date Taking? Authorizing Provider  Ascorbic Acid (VITAMIN C WITH ROSE HIPS) 500 MG tablet Take 500 mg by mouth daily.   Yes [provider]  magnesium 30 MG tablet Take 30 mg by mouth 2 (two) times daily.   Yes [provider]  methylPREDNISolone (MEDROL DOSEPAK) 4 MG TBPK tablet tad 12/18/21  Yes Eustace Moore, MD  traMADol-acetaminophen (ULTRACET) 37.5-325 MG tablet Take 1-2 tablets by mouth every 6 (six) hours as needed. 12/18/21  Yes Eustace Moore, MD  Vitamin D, Ergocalciferol, (DRISDOL) 1.25 MG (50000 UNIT) CAPS capsule Take 50,000 Units by mouth every 7 (seven) days.   Yes [provider]  vitamin E 1000 UNIT capsule Take 1,000 Units by mouth daily.   Yes [provider]  zinc gluconate 50 MG tablet Take 50 mg by mouth daily.   Yes [provider]    Family History Family History   Problem Relation Age of Onset   Healthy Mother    Healthy Father    Healthy Brother     Social History Social History   Tobacco Use   Smoking status: Former    Types: Cigarettes   Smokeless tobacco: Never  Vaping Use   Vaping Use: Never used  Substance Use Topics   Alcohol use: Yes    Comment: occasionally   Drug use: Never     Allergies   Morphine   Review of Systems Review of Systems See HPI  Physical Exam Triage Vital Signs ED Triage Vitals  Enc Vitals Group     BP 12/18/21 0828 119/79     Pulse Rate 12/18/21 0828 81     Resp 12/18/21 0828 18     Temp 12/18/21 0828 98.7 F (37.1 C)     Temp Source 12/18/21 0828 Oral     SpO2 12/18/21 0828 96 %     Weight 12/18/21 0829 215 lb (97.5 kg)     Height 12/18/21 0829 6\' 1"  (1.854 m)     Head Circumference --      Peak Flow --      Pain Score 12/18/21 0829 6     Pain Loc --  Pain Edu? --      Excl. in GC? --    No data found.  Updated Vital Signs BP 119/79 (BP Location: Right Arm)   Pulse 81   Temp 98.7 F (37.1 C) (Oral)   Resp 18   Ht 6\' 1"  (1.854 m)   Wt 97.5 kg   SpO2 96%   BMI 28.37 kg/m       Physical Exam Constitutional:      General: He is not in acute distress.    Appearance: He is well-developed and normal weight. He is not ill-appearing.  HENT:     Head: Normocephalic and atraumatic.  Eyes:     Conjunctiva/sclera: Conjunctivae normal.     Pupils: Pupils are equal, round, and reactive to light.  Cardiovascular:     Rate and Rhythm: Normal rate and regular rhythm.     Heart sounds: Normal heart sounds.  Pulmonary:     Effort: Pulmonary effort is normal. No respiratory distress.     Breath sounds: Normal breath sounds.  Chest:     Chest wall: Tenderness present.  Abdominal:     General: There is no distension.     Palpations: Abdomen is soft.     Tenderness: There is abdominal tenderness. There is no right CVA tenderness or left CVA tenderness.     Comments: There is  tenderness in the lower costal border in the midaxillary line.  There is mild tenderness to deep palpation of the right upper quadrant.  No hepatomegaly.  No guarding or rebound.  Musculoskeletal:        General: Normal range of motion.     Cervical back: Normal range of motion.  Skin:    General: Skin is warm and dry.     Findings: No rash.  Neurological:     General: No focal deficit present.     Mental Status: He is alert.     Gait: Gait normal.      UC Treatments / Results  Labs (all labs ordered are listed, but only abnormal results are displayed) Labs Reviewed  CBC WITH DIFFERENTIAL/PLATELET  COMPLETE METABOLIC PANEL WITH GFR  POCT URINALYSIS DIP (MANUAL ENTRY)    EKG   Radiology No results found.  Procedures Procedures (including critical care time)  Medications Ordered in UC Medications - No data to display  Initial Impression / Assessment and Plan / UC Course  I have reviewed the triage vital signs and the nursing notes.  Pertinent labs & imaging results that were available during my care of the patient were reviewed by me and considered in my medical decision making (see chart for details).     This seems like costochondritis.  Patient is worried about his liver.  We will get blood work to check.  We will treat with steroids and pain management.  Return if not better in a few days Final Clinical Impressions(s) / UC Diagnoses   Final diagnoses:  Flank pain, acute  Abdominal pain, right upper quadrant     Discharge Instructions      Take the Medrol as directed.  Take all of day 1 today.  This is a steroid anti-inflammatory to reduce pain and swelling in the ribs Take Ultracet if needed for pain Check MyChart tomorrow for your lab test report.  You will be called if any tests are abnormal Follow-up if you fail to improve by next week   ED Prescriptions     Medication Sig Dispense Auth. Provider  methylPREDNISolone (MEDROL DOSEPAK) 4 MG TBPK  tablet tad 21 tablet Raylene Everts, MD   traMADol-acetaminophen (ULTRACET) 37.5-325 MG tablet Take 1-2 tablets by mouth every 6 (six) hours as needed. 20 tablet Raylene Everts, MD      I have reviewed the PDMP during this encounter.   Raylene Everts, MD 12/18/21 [phone]

## 2021-12-20 LAB — CBC WITH DIFFERENTIAL/PLATELET
Absolute Monocytes: 495 cells/uL (ref 200–950)
Basophils Absolute: 39 cells/uL (ref 0–200)
Basophils Relative: 0.7 %
Eosinophils Absolute: 83 cells/uL (ref 15–500)
Eosinophils Relative: 1.5 %
HCT: 47.5 % (ref 38.5–50.0)
Hemoglobin: 16.2 g/dL (ref 13.2–17.1)
Lymphs Abs: 2019 cells/uL (ref 850–3900)
MCH: 30.9 pg (ref 27.0–33.0)
MCHC: 34.1 g/dL (ref 32.0–36.0)
MCV: 90.6 fL (ref 80.0–100.0)
MPV: 11.8 fL (ref 7.5–12.5)
Monocytes Relative: 9 %
Neutro Abs: 2866 cells/uL (ref 1500–7800)
Neutrophils Relative %: 52.1 %
Platelets: 261 10*3/uL (ref 140–400)
RBC: 5.24 10*6/uL (ref 4.20–5.80)
RDW: 12.3 % (ref 11.0–15.0)
Total Lymphocyte: 36.7 %
WBC: 5.5 10*3/uL (ref 3.8–10.8)

## 2021-12-20 LAB — COMPLETE METABOLIC PANEL WITH GFR
AG Ratio: 2 (calc) (ref 1.0–2.5)
ALT: 18 U/L (ref 9–46)
AST: 14 U/L (ref 10–40)
Albumin: 4.9 g/dL (ref 3.6–5.1)
Alkaline phosphatase (APISO): 73 U/L (ref 36–130)
BUN: 11 mg/dL (ref 7–25)
CO2: 27 mmol/L (ref 20–32)
Calcium: 9.8 mg/dL (ref 8.6–10.3)
Chloride: 108 mmol/L (ref 98–110)
Creat: 0.98 mg/dL (ref 0.60–1.26)
Globulin: 2.5 g/dL (calc) (ref 1.9–3.7)
Glucose, Bld: 95 mg/dL (ref 65–99)
Potassium: 4.9 mmol/L (ref 3.5–5.3)
Sodium: 141 mmol/L (ref 135–146)
Total Bilirubin: 0.6 mg/dL (ref 0.2–1.2)
Total Protein: 7.4 g/dL (ref 6.1–8.1)
eGFR: 102 mL/min/{1.73_m2} (ref >=60)

## 2022-11-18 ENCOUNTER — Other Ambulatory Visit: Payer: Self-pay

## 2022-11-18 ENCOUNTER — Ambulatory Visit (INDEPENDENT_AMBULATORY_CARE_PROVIDER_SITE_OTHER): Payer: Self-pay

## 2022-11-18 ENCOUNTER — Ambulatory Visit
Admission: EM | Admit: 2022-11-18 | Discharge: 2022-11-18 | Disposition: A | Discharge status: Home / Self Care | Payer: Self-pay | Attending: Physician Assistant | Admitting: Physician Assistant

## 2022-11-18 DIAGNOSIS — J069 Acute upper respiratory infection, unspecified: Secondary | ICD-10-CM

## 2022-11-18 DIAGNOSIS — R059 Cough, unspecified: Secondary | ICD-10-CM

## 2022-11-18 LAB — POC SARS CORONAVIRUS 2 AG -  ED: SARS Coronavirus 2 Ag: NEGATIVE

## 2022-11-18 MED ORDER — ACETAMINOPHEN 325 MG PO TABS
650.0000 mg | ORAL_TABLET | Freq: Four times a day (QID) | ORAL | Status: DC | PRN
  Administered 2022-11-18: 650 mg via ORAL

## 2022-11-18 NOTE — ED Triage Notes (Signed)
Body aches, sore throat, fever, cough x 2 days

## 2022-11-18 NOTE — Discharge Instructions (Signed)
Tylenol every 4 hours.  Drink plenty of fluids.

## 2022-11-18 NOTE — ED Provider Notes (Signed)
[name]    CSN: [id number] Arrival date & time: 11/18/22  1311      History   Chief Complaint Chief Complaint  Patient presents with   Cough    HPI Justin Travis is a 37 y.o. male.   Patient complains of a cough and congestion for the past 2 days.  Patient reports having a fever and bodyaches.  The history is provided by the patient.  Cough Cough characteristics:  Non-productive Duration:  2 days Timing:  Constant Smoker: no   Worsened by:  Nothing Associated symptoms: no chills   Risk factors: no recent infection     Past Medical History:  Diagnosis Date   Anxiety    Bronchitis    Pneumonia    SVT (supraventricular tachycardia)    none since age 15    There are no problems to display for this patient.   No past surgical history on file.     Home Medications    Prior to Admission medications   Medication Sig Start Date End Date Taking? Authorizing Provider  Ascorbic Acid (VITAMIN C WITH ROSE HIPS) 500 MG tablet Take 500 mg by mouth daily.    [provider]  magnesium 30 MG tablet Take 30 mg by mouth 2 (two) times daily.    [provider]  methylPREDNISolone (MEDROL DOSEPAK) 4 MG TBPK tablet tad 12/18/21   Eustace Moore, MD  traMADol-acetaminophen (ULTRACET) 37.5-325 MG tablet Take 1-2 tablets by mouth every 6 (six) hours as needed. 12/18/21   Eustace Moore, MD  Vitamin D, Ergocalciferol, (DRISDOL) 1.25 MG (50000 UNIT) CAPS capsule Take 50,000 Units by mouth every 7 (seven) days.    [provider]  vitamin E 1000 UNIT capsule Take 1,000 Units by mouth daily.    [provider]  zinc gluconate 50 MG tablet Take 50 mg by mouth daily.    [provider]    Family History Family History  Problem Relation Age of Onset   Healthy Mother    Healthy Father    Healthy Brother     Social History Social History   Tobacco Use   Smoking status: Former    Types: Cigarettes    Smokeless tobacco: Never  Vaping Use   Vaping status: Never Used  Substance Use Topics   Alcohol use: Yes    Comment: occasionally   Drug use: Never     Allergies   Morphine   Review of Systems Review of Systems  Constitutional:  Negative for chills.  Respiratory:  Positive for cough.   All other systems reviewed and are negative.    Physical Exam Triage Vital Signs ED Triage Vitals  Encounter Vitals Group     BP 11/18/22 1320 133/85     Systolic BP Percentile --      Diastolic BP Percentile --      Pulse Rate 11/18/22 1320 (!) 138     Resp 11/18/22 1320 16     Temp 11/18/22 1320 98.9 F (37.2 C)     Temp Source 11/18/22 1320 Oral     SpO2 11/18/22 1320 95 %     Weight --      Height --      Head Circumference --      Peak Flow --      Pain Score 11/18/22 1322 5     Pain Loc --      Pain Education --      Exclude from [employer]  Chart --    No data found.  Updated Vital Signs BP 133/85   Pulse (!) 138   Temp 98.9 F (37.2 C) (Oral)   Resp 16   SpO2 95%   Visual Acuity Right Eye Distance:   Left Eye Distance:   Bilateral Distance:    Right Eye Near:   Left Eye Near:    Bilateral Near:     Physical Exam Vitals and nursing note reviewed.  Constitutional:      Appearance: He is well-developed.  HENT:     Head: Normocephalic.     Nose: Nose normal.  Cardiovascular:     Rate and Rhythm: Tachycardia present.  Pulmonary:     Effort: Pulmonary effort is normal.  Abdominal:     General: There is no distension.  Musculoskeletal:        General: Normal range of motion.     Cervical back: Normal range of motion.  Skin:    General: Skin is warm.  Neurological:     General: No focal deficit present.     Mental Status: He is alert and oriented to person, place, and time.      UC Treatments / Results  Labs (all labs ordered are listed, but only abnormal results are displayed) Labs Reviewed  POC SARS CORONAVIRUS 2 AG -  ED   COVID is  negative EKG   Radiology DG Chest 2 View  Result Date: 11/18/2022 CLINICAL DATA:  cough EXAM: CHEST - 2 VIEW COMPARISON:  December 2021 FINDINGS: The cardiomediastinal silhouette is within normal limits. No pleural effusion. No pneumothorax. No mass or consolidation. No acute osseous abnormality. IMPRESSION: No acute findings in the chest. Electronically Signed   By: Olive Bass M.D.   On: 11/18/2022 15:55    Procedures Procedures (including critical care time)  Medications Ordered in UC Medications  acetaminophen (TYLENOL) tablet 650 mg (650 mg Oral Given 11/18/22 1429)    Initial Impression / Assessment and Plan / UC Course  I have reviewed the triage vital signs and the nursing notes.  Pertinent labs & imaging results that were available during my care of the patient were reviewed by me and considered in my medical decision making (see chart for details).     Patient counseled on symptomatic treatment Final Clinical Impressions(s) / UC Diagnoses   Final diagnoses:  Viral URI with cough   Discharge Instructions   None    ED Prescriptions   None    PDMP not reviewed this encounter. An After Visit Summary was printed and given to the patient.       Elson Areas, [place] 11/18/22 1700

## 2022-11-18 NOTE — ED Notes (Signed)
Sophia PA-C notified of pt's HR
